# Patient Record
Sex: Male | Born: 1965 | Hispanic: Yes | Marital: Single | Smoking: Current every day smoker
Health system: Southern US, Community
[De-identification: ages and names within clinical notes are randomized; demographics above are authoritative.]

## PROBLEM LIST (undated history)

## (undated) DIAGNOSIS — N200 Calculus of kidney: Secondary | ICD-10-CM

## (undated) DIAGNOSIS — E119 Type 2 diabetes mellitus without complications: Secondary | ICD-10-CM

---

## 2013-08-12 ENCOUNTER — Emergency Department: Payer: Self-pay | Admitting: Internal Medicine

## 2019-08-13 ENCOUNTER — Other Ambulatory Visit: Payer: Self-pay

## 2019-08-13 DIAGNOSIS — Z20822 Contact with and (suspected) exposure to covid-19: Secondary | ICD-10-CM

## 2019-08-14 LAB — NOVEL CORONAVIRUS, NAA: SARS-CoV-2, NAA: DETECTED — AB

## 2019-11-16 ENCOUNTER — Encounter: Payer: Self-pay | Admitting: Emergency Medicine

## 2019-11-16 ENCOUNTER — Other Ambulatory Visit: Payer: Self-pay

## 2019-11-16 ENCOUNTER — Emergency Department
Admission: EM | Admit: 2019-11-16 | Discharge: 2019-11-16 | Disposition: A | Discharge status: Home / Self Care | Payer: Self-pay | Attending: Emergency Medicine | Admitting: Emergency Medicine

## 2019-11-16 ENCOUNTER — Emergency Department: Payer: Self-pay

## 2019-11-16 DIAGNOSIS — Z23 Encounter for immunization: Secondary | ICD-10-CM | POA: Insufficient documentation

## 2019-11-16 DIAGNOSIS — W268XXA Contact with other sharp object(s), not elsewhere classified, initial encounter: Secondary | ICD-10-CM | POA: Insufficient documentation

## 2019-11-16 DIAGNOSIS — F17200 Nicotine dependence, unspecified, uncomplicated: Secondary | ICD-10-CM | POA: Insufficient documentation

## 2019-11-16 DIAGNOSIS — Y9389 Activity, other specified: Secondary | ICD-10-CM | POA: Insufficient documentation

## 2019-11-16 DIAGNOSIS — Y929 Unspecified place or not applicable: Secondary | ICD-10-CM | POA: Insufficient documentation

## 2019-11-16 DIAGNOSIS — S62636A Displaced fracture of distal phalanx of right little finger, initial encounter for closed fracture: Secondary | ICD-10-CM | POA: Insufficient documentation

## 2019-11-16 DIAGNOSIS — Y999 Unspecified external cause status: Secondary | ICD-10-CM | POA: Insufficient documentation

## 2019-11-16 DIAGNOSIS — S61219A Laceration without foreign body of unspecified finger without damage to nail, initial encounter: Secondary | ICD-10-CM

## 2019-11-16 DIAGNOSIS — E119 Type 2 diabetes mellitus without complications: Secondary | ICD-10-CM | POA: Insufficient documentation

## 2019-11-16 DIAGNOSIS — S62636B Displaced fracture of distal phalanx of right little finger, initial encounter for open fracture: Secondary | ICD-10-CM

## 2019-11-16 DIAGNOSIS — S61216A Laceration without foreign body of right little finger without damage to nail, initial encounter: Secondary | ICD-10-CM | POA: Insufficient documentation

## 2019-11-16 HISTORY — DX: Type 2 diabetes mellitus without complications: E11.9

## 2019-11-16 MED ORDER — LIDOCAINE HCL (PF) 1 % IJ SOLN
5.0000 mL | Freq: Once | INTRAMUSCULAR | Status: AC
  Administered 2019-11-16: 17:00:00 5 mL
  Filled 2019-11-16: qty 5

## 2019-11-16 MED ORDER — CEPHALEXIN 500 MG PO CAPS: 500.0000 mg | ORAL_CAPSULE | Freq: Three times a day (TID) | ORAL | 0 refills | Status: AC

## 2019-11-16 MED ORDER — TETANUS-DIPHTH-ACELL PERTUSSIS 5-2.5-18.5 LF-MCG/0.5 IM SUSP
0.5000 mL | Freq: Once | INTRAMUSCULAR | Status: AC
  Administered 2019-11-16: 17:00:00 0.5 mL via INTRAMUSCULAR
  Filled 2019-11-16: qty 0.5

## 2019-11-16 NOTE — ED Notes (Signed)
See triage note  Presents with laceration to right 5 th finger  States he was working with a pipe and the pipe slipped  Hitting finger

## 2019-11-16 NOTE — ED Notes (Signed)
Deep cut to distal right fifth finger.  Able to bend finger.  Bleeding controlled.  DSD applied.

## 2019-11-16 NOTE — ED Provider Notes (Signed)
[facility] Emergency Department Provider Note  ____________________________________________   First MD Initiated Contact with Patient 11/16/19 1545     (approximate)  I have reviewed the triage vital signs and the nursing notes.   HISTORY  Chief Complaint Laceration    HPI Collin Peters is a 53 y.o. male presents emergency department complaining of right fifth finger  injury.  Patient states they had cut pipe and he was going to put it together and it slipped cutting his finger.  States area was very sharp.  Unsure of his last tetanus.  No other injuries reported.   Past Medical History:  Diagnosis Date   Diabetes mellitus without complication (HCC)     There are no active problems to display for this patient.   History reviewed. No pertinent surgical history.  Prior to Admission medications   Medication Sig Start Date End Date Taking? Authorizing Provider  cephALEXin (KEFLEX) 500 MG capsule Take 1 capsule (500 mg total) by mouth 3 (three) times daily. 11/16/19   Faythe Ghee, PA-C    Allergies Patient has no known allergies.  No family history on file.  Social History Social History   Tobacco Use   Smoking status: Current Every Day Smoker   Smokeless tobacco: Never Used  Substance Use Topics   Alcohol use: Not on file   Drug use: Not on file    Review of Systems  Constitutional: No fever/chills Eyes: No visual changes. ENT: No sore throat. Respiratory: Denies cough Genitourinary: Negative for dysuria. Musculoskeletal: Negative for back pain.  Positive laceration to the right fifth finger Skin: Negative for rash.    ____________________________________________   PHYSICAL EXAM:  VITAL SIGNS: ED Triage Vitals  Enc Vitals Group     BP 11/16/19 1519 (!) 146/94     Pulse Rate 11/16/19 1519 80     Resp 11/16/19 1519 16     Temp 11/16/19 1519 97.6 F (36.4 C)     Temp Source 11/16/19 1519 Oral     SpO2  11/16/19 1519 100 %     Weight 11/16/19 1516 220 lb (99.8 kg)     Height 11/16/19 1516 5\' 9"  (1.753 m)     Head Circumference --      Peak Flow --      Pain Score 11/16/19 1516 5     Pain Loc --      Pain Edu? --      Excl. in [place]? --     Constitutional: Alert and oriented. Well appearing and in no acute distress. Eyes: Conjunctivae are normal.  Head: Atraumatic. Nose: No congestion/rhinnorhea. Mouth/Throat: Mucous membranes are moist.   Neck:  supple no lymphadenopathy noted Cardiovascular: Normal rate, regular rhythm. Respiratory: Normal respiratory effort.  No retractions,  GU: deferred Musculoskeletal: FROM all extremities, warm and well perfused, positive laceration to the right fifth finger, tender at the distal phalanx, neurovascular intact Neurologic:  Normal speech and language.  Skin:  Skin is warm, dry . No rash noted. Psychiatric: Mood and affect are normal. Speech and behavior are normal.  ____________________________________________   LABS (all labs ordered are listed, but only abnormal results are displayed)  Labs Reviewed - No data to display ____________________________________________   ____________________________________________  RADIOLOGY  X-ray of the right fifth finger shows a distal tuft fracture  ____________________________________________   PROCEDURES  Procedure(s) performed:   11/26/20Marland KitchenLaceration Repair  Date/Time: 11/16/2019 5:02 PM Performed by: 11/18/2019, PA-C Authorized by: Faythe Ghee, PA-C  Consent:    Consent obtained:  Verbal   Consent given by:  Patient   Risks discussed:  Infection, pain, retained foreign body, tendon damage, poor cosmetic result, need for additional repair, nerve damage, poor wound healing and vascular damage Anesthesia (see MAR for exact dosages):    Anesthesia method:  Nerve block   Block needle gauge:  27 G   Block anesthetic:  Lidocaine 1% w/o epi   Block injection procedure:  Anatomic  landmarks identified, introduced needle, incremental injection, anatomic landmarks palpated and negative aspiration for blood   Block outcome:  Anesthesia achieved Laceration details:    Location:  Finger   Finger location:  R small finger   Length (cm):  6   Depth (mm):  2 Repair type:    Repair type:  Simple Pre-procedure details:    Preparation:  Patient was prepped and draped in usual sterile fashion Exploration:    Hemostasis achieved with:  Direct pressure   Wound exploration: wound explored through full range of motion     Wound extent: underlying fracture     Wound extent: no foreign bodies/material noted and no tendon damage noted     Contaminated: no   Treatment:    Area cleansed with:  Betadine and saline   Amount of cleaning:  Extensive   Irrigation solution:  Sterile saline   Irrigation method:  Syringe and tap Skin repair:    Repair method:  Sutures   Suture size:  5-0   Suture material:  Nylon   Suture technique:  Simple interrupted   Number of sutures:  6 Approximation:    Approximation:  Close Post-procedure details:    Dressing:  Non-adherent dressing and splint for protection   Patient tolerance of procedure:  Tolerated well, no immediate complications      ____________________________________________   INITIAL IMPRESSION / ASSESSMENT AND PLAN / ED COURSE  Pertinent labs & imaging results that were available during my care of the patient were reviewed by me and considered in my medical decision making (see chart for details).   Patient is a 53 year old male presents emergency department with right fifth finger injury.  See HPI  Physical exam shows patient appears well.  Laceration and contusion noted to the right fifth finger.  X-ray of the right fifth finger shows a distal tuft fracture  Tdap updated by the nursing staff  See procedure note for repair.  Patient was placed in a finger splint and dressing was applied by nursing staff.  He was put  on Keflex.  Patient is refusing pain medication as he would like to drink alcohol instead of taking narcotics.  He is to follow-up with orthopedics.  Have sutures removed in 7 days.   Collin Peters was evaluated in Emergency Department on 11/16/2019 for the symptoms described in the history of present illness. He was evaluated in the context of the global COVID-19 pandemic, which necessitated consideration that the patient might be at risk for infection with the SARS-CoV-2 virus that causes COVID-19. Institutional protocols and algorithms that pertain to the evaluation of patients at risk for COVID-19 are in a state of rapid change based on information released by regulatory bodies including the CDC and federal and state organizations. These policies and algorithms were followed during the patient's care in the ED.   As part of my medical decision making, I reviewed the following data within the [place] notes reviewed and incorporated, Old chart reviewed, Radiograph reviewed ,  Notes from prior ED visits and [place] Controlled Substance Database  ____________________________________________   FINAL CLINICAL IMPRESSION(S) / ED DIAGNOSES  Final diagnoses:  Laceration of right little finger without foreign body without damage to nail, initial encounter  Open displaced fracture of distal phalanx of right little finger, initial encounter      NEW MEDICATIONS STARTED DURING THIS VISIT:  Discharge Medication List as of 11/16/2019  4:57 PM    START taking these medications   Details  cephALEXin (KEFLEX) 500 MG capsule Take 1 capsule (500 mg total) by mouth 3 (three) times daily., Starting Tue 11/16/2019, Normal         Note:  This document was prepared using Dragon voice recognition software and may include unintentional dictation errors.    Faythe GheeFisher, Dagan Heinz W, PA-C 11/16/19 1727    Emily FilbertWilliams, Jonathan E, MD 11/16/19 [phone]

## 2019-11-16 NOTE — ED Triage Notes (Signed)
Cut right fifth finger at work.  States cut finger on drainage pipe.

## 2019-11-16 NOTE — ED Notes (Signed)
No WC profile found for [employer]

## 2019-11-26 ENCOUNTER — Other Ambulatory Visit: Payer: Self-pay

## 2019-11-26 ENCOUNTER — Emergency Department
Admission: EM | Admit: 2019-11-26 | Discharge: 2019-11-26 | Disposition: A | Discharge status: Home / Self Care | Payer: Self-pay | Attending: Emergency Medicine | Admitting: Emergency Medicine

## 2019-11-26 DIAGNOSIS — F17210 Nicotine dependence, cigarettes, uncomplicated: Secondary | ICD-10-CM | POA: Insufficient documentation

## 2019-11-26 DIAGNOSIS — Z4802 Encounter for removal of sutures: Secondary | ICD-10-CM

## 2019-11-26 DIAGNOSIS — E119 Type 2 diabetes mellitus without complications: Secondary | ICD-10-CM | POA: Insufficient documentation

## 2019-11-26 DIAGNOSIS — X58XXXD Exposure to other specified factors, subsequent encounter: Secondary | ICD-10-CM | POA: Insufficient documentation

## 2019-11-26 DIAGNOSIS — S61216D Laceration without foreign body of right little finger without damage to nail, subsequent encounter: Secondary | ICD-10-CM | POA: Insufficient documentation

## 2019-11-26 NOTE — ED Notes (Signed)
See triage note  Presents for suture removal    Sutures intact to right 5th finger

## 2019-11-26 NOTE — ED Triage Notes (Signed)
Pt to ED via POV for suture removal. Pt is in NAD

## 2019-11-26 NOTE — Discharge Instructions (Addendum)
Clean daily with mild soap and water.  Allow area to dry completely.  Continue to watch for any signs of infection.

## 2019-11-26 NOTE — ED Provider Notes (Signed)
[facility] Emergency Department Provider Note  ____________________________________________   First MD Initiated Contact with Patient 11/26/19 1445     (approximate)  I have reviewed the triage vital signs and the nursing notes.   HISTORY  Chief Complaint Suture / Staple Removal    HPI Collin Peters is a 53 y.o. male presents to the ED for suture removal.  Patient was seen in the ED on 11/16/2019 for laceration of his right fifth digit.  He denies any problems.  No drainage.       Past Medical History:  Diagnosis Date  . Diabetes mellitus without complication ([place])     There are no active problems to display for this patient.   No past surgical history on file.  Prior to Admission medications   Medication Sig Start Date End Date Taking? Authorizing Provider  cephALEXin (KEFLEX) 500 MG capsule Take 1 capsule (500 mg total) by mouth 3 (three) times daily. 11/16/19   Versie Starks, PA-C    Allergies Patient has no known allergies.  No family history on file.  Social History Social History   Tobacco Use  . Smoking status: Current Every Day Smoker  . Smokeless tobacco: Never Used  Substance Use Topics  . Alcohol use: Not on file  . Drug use: Not on file    Review of Systems Constitutional: No fever/chills Cardiovascular: Denies chest pain. Respiratory: Denies shortness of breath. Musculoskeletal: Negative for finger pain. Skin: Sutured right fifth finger. Neurological: Negative for headaches, focal weakness or numbness. ___________________________________________   PHYSICAL EXAM:  VITAL SIGNS: ED Triage Vitals [11/26/19 1440]  Enc Vitals Group     BP (!) 132/93     Pulse Rate 89     Resp 16     Temp 98.8 F (37.1 C)     Temp Source Oral     SpO2 96 %     Weight      Height      Head Circumference      Peak Flow      Pain Score 0     Pain Loc      Pain Edu?      Excl. in [place]?    Constitutional: Alert and  oriented. Well appearing and in no acute distress. Eyes: Conjunctivae are normal.  Head: Atraumatic. Neck: No stridor.   Cardiovascular: Normal rate, regular rhythm. Grossly normal heart sounds.  Good peripheral circulation. Respiratory: Normal respiratory effort.  No retractions. Lungs CTAB. Musculoskeletal: On examination of the right fifth finger there is no gross deformity noted and patient is able to flex and extend without difficulty.  Sutured area is without erythema or drainage. Neurologic:  Normal speech and language. No gross focal neurologic deficits are appreciated. No gait instability. Skin:  Skin is warm, dry. Psychiatric: Mood and affect are normal. Speech and behavior are normal.  ____________________________________________   LABS (all labs ordered are listed, but only abnormal results are displayed)  Labs Reviewed - No data to display  PROCEDURES  Procedure(s) performed (including Critical Care):  Procedures Sutures were removed by this provider.  ____________________________________________   INITIAL IMPRESSION / ASSESSMENT AND PLAN / ED COURSE  As part of my medical decision making, I reviewed the following data within the electronic MEDICAL RECORD NUMBER Notes from prior ED visits and [place] Controlled Substance Database  53 year old male presents to the ED for suture removal.  Patient was seen on 11/16/2023 a laceration to his right fifth finger.  Sutures  were removed by this provider without any difficulty.  ____________________________________________   FINAL CLINICAL IMPRESSION(S) / ED DIAGNOSES  Final diagnoses:  Encounter for removal of sutures     ED Discharge Orders    None       Note:  This document was prepared using Dragon voice recognition software and may include unintentional dictation errors.    Tommi Rumps, PA-C 11/26/19 1501    Concha Se, MD 11/27/19 [phone]

## 2020-08-16 IMAGING — DX DG FINGER LITTLE 2+V*R*
3 series · 3 of 3 positions shown · non-contrast
Comparison: None.

CLINICAL DATA: Small finger laceration.

EXAM:
RIGHT LITTLE FINGER 2+V

[finger ap]
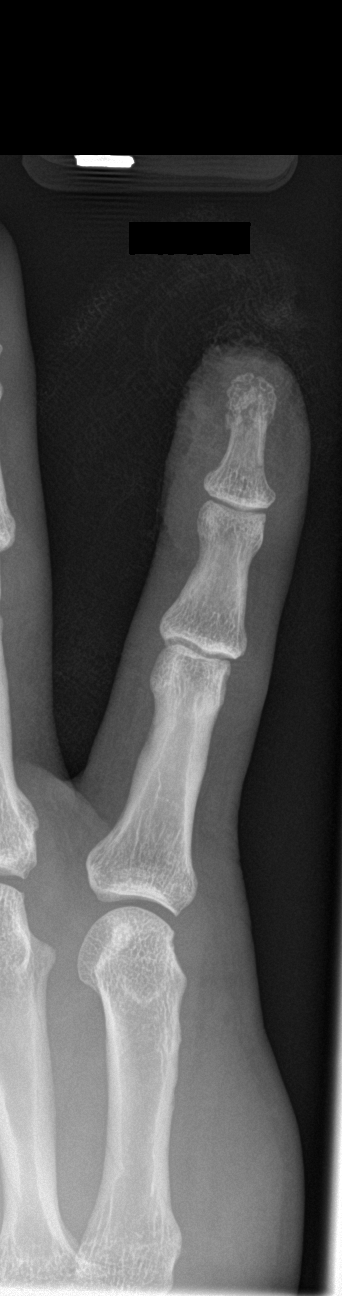

[finger obl]
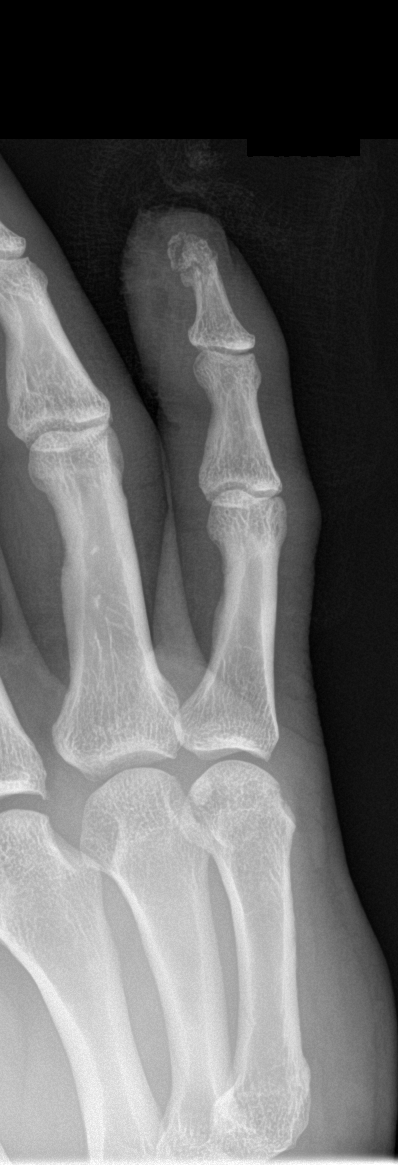

[finger lat]
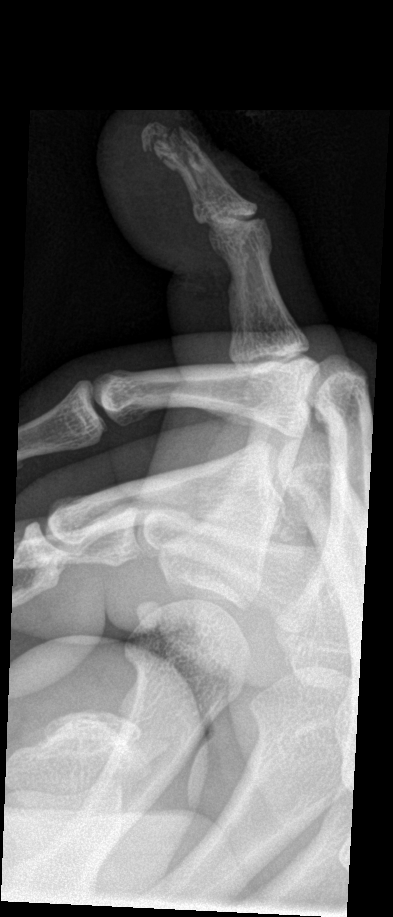

[3 of 3 positions shown; findings below may reference images not displayed]

FINDINGS: Acute comminuted minimally displaced fracture of the fifth distal
phalanx tuft with overlying soft tissue laceration and irregularity.
No dislocation. Joint spaces are preserved. Bone mineralization is
normal.
IMPRESSION: 1. Soft tissue injury and laceration at the tip of the small finger
with underlying acute fracture of the fifth distal phalanx tuft.

## 2020-12-26 ENCOUNTER — Other Ambulatory Visit: Payer: Self-pay

## 2020-12-26 ENCOUNTER — Ambulatory Visit
Admission: EM | Admit: 2020-12-26 | Discharge: 2020-12-26 | Disposition: A | Discharge status: Home / Self Care | Payer: Self-pay | Attending: Physician Assistant | Admitting: Physician Assistant

## 2020-12-26 DIAGNOSIS — L299 Pruritus, unspecified: Secondary | ICD-10-CM

## 2020-12-26 DIAGNOSIS — L259 Unspecified contact dermatitis, unspecified cause: Secondary | ICD-10-CM

## 2020-12-26 MED ORDER — PREDNISONE 10 MG (21) PO TBPK: ORAL_TABLET | Freq: Every day | ORAL | 0 refills | Status: AC

## 2020-12-26 MED ORDER — METHYLPREDNISOLONE SODIUM SUCC 125 MG IJ SOLR
125.0000 mg | Freq: Once | INTRAMUSCULAR | Status: AC
  Administered 2020-12-26: 125 mg via INTRAMUSCULAR

## 2020-12-26 NOTE — ED Triage Notes (Signed)
Pt sts he was cutting down bushes on Saturday and got into some poison ivy. Rashes on bila arms and hand lower abd. sts he have been taking benadryl and using calamine lotion with some relief.

## 2020-12-26 NOTE — Discharge Instructions (Addendum)
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 Empiece a tomar las pastillas de corticosteroides maana. Puede continuar con Benadryl para la picazn. Intenta no rascarte. Haga un seguimiento con nosotros si no mejora en los prximos das o si el sarpullido parece estar extendindose. Vaya a la sala de emergencias si tiene alguna dificultad para respirar, debilidad o dificultad para tragar.   You have been given a corticosteroid injection in the clinic today since your poison ivy rash is severe and affecting your face.  Start the corticosteroid pills tomorrow.  You can continue Benadryl for itching.  Try not to scratch.  Follow-up with [place] if you are not getting better over the next few days or if the rash seems to be spreading.  Go to ER if you have any breathing difficulty, weakness, or trouble swallowing.

## 2020-12-26 NOTE — ED Provider Notes (Signed)
[facility]    CSN: [id number] Arrival date & time: 12/26/20  1040      History   Chief Complaint Chief Complaint  Patient presents with  . Poison Ivy    HPI Collin Peters is a 55 y.o. male presenting for rash of the face, arms, chest, abdomen, and groin region.  Patient states that he was cutting down poison ivy at his house 3 days ago and then developed a rash after that.  He has been taking Benadryl and applying calamine lotion without any relief.  He says it seems to be spreading.  He admits to intense itching, but denies any pain.  He states that he is sensitive to poison ivy and has broken out this bad in the past.  He denies any fever or weakness.  He says that his face feels swollen.  Denies any swallowing or breathing difficulty.  Denies any chest tightness or throat tightness.  No other complaints or concerns.  HPI  Past Medical History:  Diagnosis Date  . Diabetes mellitus without complication (HCC)     There are no problems to display for this patient.   History reviewed. No pertinent surgical history.     Home Medications    Prior to Admission medications   Medication Sig Start Date End Date Taking? Authorizing Provider  predniSONE (STERAPRED UNI-PAK 21 TAB) 10 MG (21) TBPK tablet Take by mouth daily. Take 6 tabs by mouth daily  for 2 days, then 5 tabs for 2 days, then 4 tabs for 2 days, then 3 tabs for 2 days, 2 tabs for 2 days, then 1 tab by mouth daily for 2 days 12/26/20  Yes Eusebio Friendly B, PA-C  cephALEXin (KEFLEX) 500 MG capsule Take 1 capsule (500 mg total) by mouth 3 (three) times daily. 11/16/19   Faythe Ghee, PA-C    Family History No family history on file.  Social History Social History   Tobacco Use  . Smoking status: Current Every Day Smoker  . Smokeless tobacco: Never Used  Substance Use Topics  . Alcohol use: Yes  . Drug use: Never     Allergies   Patient has no known allergies.   Review of  Systems Review of Systems  Constitutional: Negative for fatigue and fever.  HENT: Positive for facial swelling. Negative for congestion and trouble swallowing.   Eyes: Negative for photophobia, discharge, redness and visual disturbance.  Respiratory: Negative for chest tightness, shortness of breath and wheezing.   Musculoskeletal: Negative for joint swelling.  Skin: Positive for rash.  Neurological: Negative for dizziness, weakness and headaches.     Physical Exam Triage Vital Signs ED Triage Vitals  Enc Vitals Group     BP 12/26/20 1256 (!) 144/105     Pulse Rate 12/26/20 1256 80     Resp 12/26/20 1256 16     Temp 12/26/20 1256 97.6 F (36.4 C)     Temp Source 12/26/20 1256 Oral     SpO2 12/26/20 1256 100 %     Weight 12/26/20 1257 220 lb (99.8 kg)     Height 12/26/20 1257 5\' 9"  (1.753 m)     Head Circumference --      Peak Flow --      Pain Score 12/26/20 1257 0     Pain Loc --      Pain Edu? --      Excl. in [place]? --    No data found.  Updated Vital Signs BP 02/23/21)  144/105   Pulse 80   Temp 97.6 F (36.4 C) (Oral)   Resp 16   Ht 5\' 9"  (1.753 m)   Wt 220 lb (99.8 kg)   SpO2 100%   BMI 32.49 kg/m       Physical Exam Vitals and nursing note reviewed.  Constitutional:      General: He is not in acute distress.    Appearance: Normal appearance. He is well-developed and well-nourished. He is not ill-appearing or toxic-appearing.  HENT:     Head: Normocephalic and atraumatic.     Nose: Nose normal. No congestion.     Mouth/Throat:     Mouth: Mucous membranes are moist.     Pharynx: Oropharynx is clear.  Eyes:     General: No scleral icterus.    Conjunctiva/sclera: Conjunctivae normal.  Cardiovascular:     Rate and Rhythm: Normal rate and regular rhythm.     Heart sounds: Normal heart sounds.  Pulmonary:     Effort: Pulmonary effort is normal. No respiratory distress.     Breath sounds: Normal breath sounds.  Abdominal:     Palpations: Abdomen is soft.      Tenderness: There is no abdominal tenderness.  Musculoskeletal:        General: No edema.     Cervical back: Neck supple.  Skin:    General: Skin is warm and dry.     Findings: Rash (vesicular and erythematous patchy and diffuse rash affecting face, bilat UEs, chest, abdomen and bilat groin. Face is moderately swollen and erythematous) present.  Neurological:     General: No focal deficit present.     Mental Status: He is alert. Mental status is at baseline.  Psychiatric:        Mood and Affect: Mood and affect and mood normal.        Behavior: Behavior normal.        Thought Content: Thought content normal.      UC Treatments / Results  Labs (all labs ordered are listed, but only abnormal results are displayed) Labs Reviewed - No data to display  EKG   Radiology No results found.  Procedures Procedures (including critical care time)  Medications Ordered in UC Medications  methylPREDNISolone sodium succinate (SOLU-MEDROL) 125 mg/2 mL injection 125 mg (125 mg Intramuscular Given 12/26/20 1334)    Initial Impression / Assessment and Plan / UC Course  I have reviewed the triage vital signs and the nursing notes.  Pertinent labs & imaging results that were available during my care of the patient were reviewed by me and considered in my medical decision making (see chart for details).   Rash is consistent with irritant dermatitis likely due to poison ivy since he states that he was cutting down poison ivy at his house.  The rash is significant and affecting his face and diffusely throughout his body.  Patient given Solu-Medrol in clinic and sent prednisone Dosepak for him to start tomorrow.  Return and ED precautions discussed with patient thoroughly.   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 Empiece a tomar las pastillas de corticosteroides maana. Puede continuar con Benadryl para la picazn. Intenta no rascarte. Haga un seguimiento con nosotros si  no mejora en los prximos das o si el sarpullido parece estar extendindose. Vaya a la sala de emergencias si tiene alguna dificultad para respirar, debilidad o dificultad para tragar.   You have been given a corticosteroid injection in the clinic today since your poison ivy rash is severe and affecting your face.  Start the corticosteroid pills tomorrow.  You can continue Benadryl for itching.  Try not to scratch.  Follow-up with [place] if you are not getting better over the next few days or if the rash seems to be spreading.  Go to ER if you have any breathing difficulty, weakness, or trouble swallowing.    ED Prescriptions    Medication Sig Dispense Auth. Provider   predniSONE (STERAPRED UNI-PAK 21 TAB) 10 MG (21) TBPK tablet Take by mouth daily. Take 6 tabs by mouth daily  for 2 days, then 5 tabs for 2 days, then 4 tabs for 2 days, then 3 tabs for 2 days, 2 tabs for 2 days, then 1 tab by mouth daily for 2 days 42 tablet Shirlee Latch, PA-C     PDMP not reviewed this encounter.   Shirlee Latch, PA-C 12/26/20 1337

## 2021-11-26 DIAGNOSIS — Z23 Encounter for immunization: Secondary | ICD-10-CM | POA: Diagnosis not present

## 2022-07-13 ENCOUNTER — Ambulatory Visit
Admission: EM | Admit: 2022-07-13 | Discharge: 2022-07-13 | Disposition: A | Discharge status: Home / Self Care | Payer: No Typology Code available for payment source | Attending: Physician Assistant | Admitting: Physician Assistant

## 2022-07-13 DIAGNOSIS — R300 Dysuria: Secondary | ICD-10-CM | POA: Diagnosis present

## 2022-07-13 DIAGNOSIS — N481 Balanitis: Secondary | ICD-10-CM | POA: Diagnosis present

## 2022-07-13 DIAGNOSIS — E1165 Type 2 diabetes mellitus with hyperglycemia: Secondary | ICD-10-CM | POA: Diagnosis present

## 2022-07-13 DIAGNOSIS — R6882 Decreased libido: Secondary | ICD-10-CM

## 2022-07-13 LAB — URINALYSIS, ROUTINE W REFLEX MICROSCOPIC
Bilirubin Urine: NEGATIVE
Glucose, UA: >1000 mg/dL — AB
Hgb urine dipstick: NEGATIVE
Leukocytes,Ua: NEGATIVE
Nitrite: NEGATIVE
Protein, ur: NEGATIVE mg/dL
Specific Gravity, Urine: 1.02 (ref 1.005–1.030)
pH: 6.5 (ref 5.0–8.0)

## 2022-07-13 LAB — GLUCOSE, CAPILLARY: Glucose-Capillary: 327 mg/dL — ABNORMAL HIGH (ref 70–99)

## 2022-07-13 MED ORDER — CLOTRIMAZOLE 1 % EX CREA: TOPICAL_CREAM | CUTANEOUS | 0 refills | Status: AC

## 2022-07-13 NOTE — ED Triage Notes (Signed)
Used Interpreter Susana [phone]  Pt c/o burning after urinating x5days.   Pt is having a decrease of Libido x1year.   Pt is not having full bowel movements and feels like there is more that did not come out x38months.  Pt asks for a check of his colon.  Pt denies any new sexual partners and is not having any drainage, itching, or abnormal swelling.

## 2022-07-13 NOTE — ED Provider Notes (Signed)
[facility]    CSN: [id number] Arrival date & time: 07/13/22  6226      History   Chief Complaint Chief Complaint  Patient presents with   Urinary Tract Infection   Constipation   Sexual Problem    HPI Jlen Wintle is a 56 y.o. male presenting for approximately 5-day history of dysuria.  Also reports urinary frequency for a longer period of time than that.  He has not had any testicular pain or swelling.  Denies GU rashes.  No concern for STIs as he only has 1 partner.  He denies any penile discharge or blood in the urine.  No fever, back pain or abdominal/pelvic pain.  No similar problems in the past.  Patient also reports concerns about decreased libido for the past 1 year.  He has not followed up with a provider about this.  He does not have a PCP.  He does report that someone told him he was diabetic about 5 years ago but he never was given any medication and does not take any medicine.  He reports to smoking tobacco every day and frequent alcohol use as well.  Interpreter service used.  HPI  Past Medical History:  Diagnosis Date   Diabetes mellitus without complication (HCC)     There are no problems to display for this patient.   History reviewed. No pertinent surgical history.     Home Medications    Prior to Admission medications   Medication Sig Start Date End Date Taking? Authorizing Provider  clotrimazole (LOTRIMIN) 1 % cream Apply to affected area 2 times daily 07/13/22 07/20/22 Yes Eusebio Friendly B, PA-C  cephALEXin (KEFLEX) 500 MG capsule Take 1 capsule (500 mg total) by mouth 3 (three) times daily. 11/16/19   Fisher, Roselyn Bering, PA-C  predniSONE (STERAPRED UNI-PAK 21 TAB) 10 MG (21) TBPK tablet Take by mouth daily. Take 6 tabs by mouth daily  for 2 days, then 5 tabs for 2 days, then 4 tabs for 2 days, then 3 tabs for 2 days, 2 tabs for 2 days, then 1 tab by mouth daily for 2 days 12/26/20   Shirlee Latch, PA-C    Family History History  reviewed. No pertinent family history.  Social History Social History   Tobacco Use   Smoking status: Every Day   Smokeless tobacco: Never  Vaping Use   Vaping Use: Never used  Substance Use Topics   Alcohol use: Yes   Drug use: Never     Allergies   Patient has no known allergies.   Review of Systems Review of Systems  Constitutional:  Negative for fatigue and fever.  Gastrointestinal:  Negative for abdominal pain, nausea and vomiting.  Genitourinary:  Positive for dysuria and frequency. Negative for genital sores, hematuria, penile discharge, penile pain, penile swelling, scrotal swelling, testicular pain and urgency.  Musculoskeletal:  Negative for arthralgias.  Skin:  Negative for rash.  Neurological:  Negative for weakness.     Physical Exam Triage Vital Signs ED Triage Vitals  Enc Vitals Group     BP 07/13/22 0856 (!) 152/101     Pulse Rate 07/13/22 0856 91     Resp 07/13/22 0856 18     Temp 07/13/22 0856 98.8 F (37.1 C)     Temp Source 07/13/22 0856 Oral     SpO2 07/13/22 0856 96 %     Weight 07/13/22 0854 190 lb (86.2 kg)     Height 07/13/22 0854 5\' 9"  (  1.753 m)     Head Circumference --      Peak Flow --      Pain Score 07/13/22 0854 4     Pain Loc --      Pain Edu? --      Excl. in [place]? --    No data found.  Updated Vital Signs BP (!) 152/101 (BP Location: Left Arm)   Pulse 91   Temp 98.8 F (37.1 C) (Oral)   Resp 18   Ht 5\' 9"  (1.753 m)   Wt 190 lb (86.2 kg)   SpO2 96%   BMI 28.06 kg/m       Physical Exam Vitals and nursing note reviewed. Exam conducted with a chaperone present.  Constitutional:      General: He is not in acute distress.    Appearance: Normal appearance. He is well-developed. He is not ill-appearing.  HENT:     Head: Normocephalic and atraumatic.  Eyes:     General: No scleral icterus.    Conjunctiva/sclera: Conjunctivae normal.  Cardiovascular:     Rate and Rhythm: Normal rate and regular rhythm.     Heart  sounds: Normal heart sounds.  Pulmonary:     Effort: Pulmonary effort is normal. No respiratory distress.     Breath sounds: Normal breath sounds.  Abdominal:     Palpations: Abdomen is soft.     Tenderness: There is no abdominal tenderness.  Genitourinary:    Penis: Uncircumcised. No tenderness, discharge or swelling.      Testes: Normal.     Comments: Erythematous rash at base of glans Musculoskeletal:     Cervical back: Neck supple.  Skin:    General: Skin is warm and dry.     Capillary Refill: Capillary refill takes less than 2 seconds.  Neurological:     General: No focal deficit present.     Mental Status: He is alert. Mental status is at baseline.     Motor: No weakness.     Gait: Gait normal.  Psychiatric:        Mood and Affect: Mood normal.        Behavior: Behavior normal.      UC Treatments / Results  Labs (all labs ordered are listed, but only abnormal results are displayed) Labs Reviewed  URINALYSIS, ROUTINE W REFLEX MICROSCOPIC - Abnormal; Notable for the following components:      Result Value   Glucose, UA >1,000 (*)    Ketones, ur TRACE (*)    All other components within normal limits  GLUCOSE, CAPILLARY - Abnormal; Notable for the following components:   Glucose-Capillary 327 (*)    All other components within normal limits  CBG MONITORING, ED  CYTOLOGY, (ORAL, ANAL, URETHRAL) ANCILLARY ONLY    EKG   Radiology No results found.  Procedures Procedures (including critical care time)  Medications Ordered in UC Medications - No data to display  Initial Impression / Assessment and Plan / UC Course  I have reviewed the triage vital signs and the nursing notes.  Pertinent labs & imaging results that were available during my care of the patient were reviewed by me and considered in my medical decision making (see chart for details).    1.  Dysuria: Patient reporting 5-day history of dysuria.  No discharge or concern for STIs.  No history of  UTIs.  On exam he does have a rash at the base of the glans.    Urinalysis today shows no sign  of UTI, only greater than 1000 glucose and trace ketones.  No suspicion for acute UTI.  Suspect patient's symptoms are related to acute balanitis, likely fungal given that he has uncontrolled diabetes.  Sent clotrimazole to pharmacy.  We also obtained GC/chlamydia testing and will call patient if positive for low suspicion for this as he reports only 1 sexual partner.  2.  Uncontrolled type 2 diabetes: Patient reports he does have a history of diabetes but never took any medicine for it.  As mentioned, urinalysis shows greater than 1000 glucose.  Fingerstick glucose is 327.  Patient reports not having eaten anything today.  Advised him that he needs further work-up and treatment for diabetes.  [facility] and advised him to contact them on Monday.  We discussed dietary changes, exercise and weight loss at this time.  3.  Decreased libido: Advised patient we do not perform work-up for this in urgent care setting.  It has also been going on for 1 year and is not a chronic issue.  Advised his primary care provider can likely work him up for this or refer him out.  Advised his decreased libido may be affected by his uncontrolled diabetes, alcohol use and tobacco use.   Final Clinical Impressions(s) / UC Diagnoses   Final diagnoses:  Balanitis  Dysuria  Uncontrolled type 2 diabetes mellitus with hyperglycemia (HCC)     Discharge Instructions      -No tienes una infeccin urinaria -Tiene una erupcin en el pene que es compatible con una infeccin por hongos. [place] la crema que envi a la farmacia. -Tambin hablamos sobre el hecho de que tiene diabetes no controlada y que puede provocar estas infecciones y causar problemas con la libido y las erecciones. -Debe hacer un seguimiento con la oficina a continuacin para una cita para establecer como paciente y hacerse exmenes de  laboratorio y tratamiento para la diabetes. -Hacer ejercicio, bajar de peso y disminuir el consumo de carbohidratos, azcar, alcohol.  [facility] at [facility], [address]. Phone number: [phone]  -You do not have a urinary infection -You do have a rash on your penis which is consistent with fungal infection. Use the cream I sent to the pharmacy. -We also talked about the fact you have uncontrolled diabetes and than can lead to these infections and cause issues with libido and getting erections.  -You should follow up with the office below for appointment to establish as a patient and have labs checked and treatment for diabetes. -Exercise, lose weight and decrease consumption of carbs, sugar, alcohol.       ED Prescriptions     Medication Sig Dispense Auth. Provider   clotrimazole (LOTRIMIN) 1 % cream Apply to affected area 2 times daily 15 g Shirlee Latch, PA-C      PDMP not reviewed this encounter.   Shirlee Latch, PA-C 07/13/22 1018

## 2022-07-13 NOTE — Discharge Instructions (Addendum)
-  No tienes una infeccin urinaria -Tiene una erupcin en el pene que es compatible con una infeccin por hongos. [place] la crema que envi a la farmacia. -Tambin hablamos sobre el hecho de que tiene diabetes no controlada y que puede provocar estas infecciones y causar problemas con la libido y las erecciones. -Debe hacer un seguimiento con la oficina a continuacin para una cita para establecer como paciente y hacerse exmenes de laboratorio y tratamiento para la diabetes. -Hacer ejercicio, bajar de peso y disminuir el consumo de carbohidratos, azcar, alcohol.  [facility] at [facility], [address]. Phone number: [phone]  -You do not have a urinary infection -You do have a rash on your penis which is consistent with fungal infection. Use the cream I sent to the pharmacy. -We also talked about the fact you have uncontrolled diabetes and than can lead to these infections and cause issues with libido and getting erections.  -You should follow up with the office below for appointment to establish as a patient and have labs checked and treatment for diabetes. -Exercise, lose weight and decrease consumption of carbs, sugar, alcohol.

## 2022-07-15 LAB — CYTOLOGY, (ORAL, ANAL, URETHRAL) ANCILLARY ONLY
Chlamydia: NEGATIVE
Comment: NEGATIVE
Comment: NORMAL
Neisseria Gonorrhea: NEGATIVE

## 2022-08-07 ENCOUNTER — Encounter: Payer: Self-pay | Admitting: Emergency Medicine

## 2022-08-07 ENCOUNTER — Emergency Department: Payer: No Typology Code available for payment source

## 2022-08-07 ENCOUNTER — Emergency Department
Admission: EM | Admit: 2022-08-07 | Discharge: 2022-08-07 | Disposition: A | Discharge status: Home / Self Care | Payer: No Typology Code available for payment source | Attending: Emergency Medicine | Admitting: Emergency Medicine

## 2022-08-07 ENCOUNTER — Other Ambulatory Visit: Payer: Self-pay

## 2022-08-07 DIAGNOSIS — E871 Hypo-osmolality and hyponatremia: Secondary | ICD-10-CM | POA: Diagnosis not present

## 2022-08-07 DIAGNOSIS — R109 Unspecified abdominal pain: Secondary | ICD-10-CM | POA: Diagnosis present

## 2022-08-07 DIAGNOSIS — E1165 Type 2 diabetes mellitus with hyperglycemia: Secondary | ICD-10-CM | POA: Diagnosis not present

## 2022-08-07 DIAGNOSIS — E119 Type 2 diabetes mellitus without complications: Secondary | ICD-10-CM

## 2022-08-07 HISTORY — DX: Calculus of kidney: N20.0

## 2022-08-07 LAB — BASIC METABOLIC PANEL
Anion gap: 7 (ref 5–15)
BUN: 18 mg/dL (ref 6–20)
CO2: 23 mmol/L (ref 22–32)
Calcium: 9.3 mg/dL (ref 8.9–10.3)
Chloride: 102 mmol/L (ref 98–111)
Creatinine, Ser: 0.81 mg/dL (ref 0.61–1.24)
GFR, Estimated: >60 mL/min (ref >60)
Glucose, Bld: 393 mg/dL — ABNORMAL HIGH (ref 70–99)
Potassium: 4.5 mmol/L (ref 3.5–5.1)
Sodium: 132 mmol/L — ABNORMAL LOW (ref 135–145)

## 2022-08-07 LAB — URINALYSIS, ROUTINE W REFLEX MICROSCOPIC
Bacteria, UA: NONE SEEN
Bilirubin Urine: NEGATIVE
Glucose, UA: >=500 mg/dL — AB
Hgb urine dipstick: NEGATIVE
Ketones, ur: NEGATIVE mg/dL
Leukocytes,Ua: NEGATIVE
Nitrite: NEGATIVE
Protein, ur: NEGATIVE mg/dL
Specific Gravity, Urine: 1.033 — ABNORMAL HIGH (ref 1.005–1.030)
Squamous Epithelial / HPF: NONE SEEN (ref 0–5)
WBC, UA: NONE SEEN WBC/hpf (ref 0–5)
pH: 6 (ref 5.0–8.0)

## 2022-08-07 LAB — CBC
HCT: 43.5 % (ref 39.0–52.0)
Hemoglobin: 15.2 g/dL (ref 13.0–17.0)
MCH: 29.7 pg (ref 26.0–34.0)
MCHC: 34.9 g/dL (ref 30.0–36.0)
MCV: 85.1 fL (ref 80.0–100.0)
Platelets: 161 10*3/uL (ref 150–400)
RBC: 5.11 MIL/uL (ref 4.22–5.81)
RDW: 11.5 % (ref 11.5–15.5)
WBC: 6.4 10*3/uL (ref 4.0–10.5)
nRBC: 0 % (ref 0.0–0.2)

## 2022-08-07 LAB — CBG MONITORING, ED: Glucose-Capillary: 278 mg/dL — ABNORMAL HIGH (ref 70–99)

## 2022-08-07 MED ORDER — BLOOD GLUCOSE MONITOR KIT: PACK | 0 refills | Status: AC

## 2022-08-07 MED ORDER — BACLOFEN 10 MG PO TABS: 10.0000 mg | ORAL_TABLET | Freq: Three times a day (TID) | ORAL | 0 refills | Status: AC

## 2022-08-07 MED ORDER — METFORMIN HCL 500 MG PO TABS: 500.0000 mg | ORAL_TABLET | Freq: Two times a day (BID) | ORAL | 0 refills | Status: AC

## 2022-08-07 MED ORDER — METFORMIN HCL 1000 MG PO TABS: 1000.0000 mg | ORAL_TABLET | Freq: Two times a day (BID) | ORAL | 3 refills | Status: AC

## 2022-08-07 MED ORDER — MELOXICAM 15 MG PO TABS: 15.0000 mg | ORAL_TABLET | Freq: Every day | ORAL | 2 refills | Status: AC

## 2022-08-07 MED ORDER — SODIUM CHLORIDE 0.9 % IV BOLUS
1000.0000 mL | Freq: Once | INTRAVENOUS | Status: AC
  Administered 2022-08-07: 1000 mL via INTRAVENOUS

## 2022-08-07 NOTE — Discharge Instructions (Signed)
Follow up with [facility]; call for an appointment Take the medication as prescribed Test your glucose 3 times a day for 2 weeks, then at least 2x a day

## 2022-08-07 NOTE — ED Notes (Signed)
Pt A&O, IV removed, pt given discharge instructions, pt ambulating with steady gait. 

## 2022-08-07 NOTE — ED Provider Notes (Signed)
[facility] Provider Note    Event Date/Time   First MD Initiated Contact with Patient 08/07/22 1322     (approximate)   History   Flank Pain   HPI  Collin Peters is a 56 y.o. male with history of diabetes and kidney stones presents emergency department complaint of left sided flank pain.  Was seen in his doctor's office yesterday for routine follow-up and they said it might be a kidney stone.  No imaging was performed.  Patient denies fever or chills.  States pain radiates from the left flank into his testicle      Physical Exam   Triage Vital Signs: ED Triage Vitals  Enc Vitals Group     BP 08/07/22 1244 (!) 143/93     Pulse Rate 08/07/22 1244 85     Resp 08/07/22 1244 16     Temp 08/07/22 1244 98.1 F (36.7 C)     Temp Source 08/07/22 1244 Oral     SpO2 08/07/22 1244 96 %     Weight 08/07/22 1246 190 lb (86.2 kg)     Height 08/07/22 1246 '5\' 9"'  (1.753 m)     Head Circumference --      Peak Flow --      Pain Score 08/07/22 1246 9     Pain Loc --      Pain Edu? --      Excl. in [place]? --     Most recent vital signs: Vitals:   08/07/22 1244 08/07/22 1353  BP: (!) 143/93 129/79  Pulse: 85 79  Resp: 16 18  Temp: 98.1 F (36.7 C)   SpO2: 96% 95%     General: Awake, no distress.   CV:  Good peripheral perfusion. regular rate and  rhythm Resp:  Normal effort.  Abd:  No distention.  No tenderness in abd, no cva tenderness Other:     ED Results / Procedures / Treatments   Labs (all labs ordered are listed, but only abnormal results are displayed) Labs Reviewed  URINALYSIS, ROUTINE W REFLEX MICROSCOPIC - Abnormal; Notable for the following components:      Result Value   Color, Urine STRAW (*)    APPearance CLEAR (*)    Specific Gravity, Urine 1.033 (*)    Glucose, UA >=500 (*)    All other components within normal limits  BASIC METABOLIC PANEL - Abnormal; Notable for the following components:   Sodium 132 (*)     Glucose, Bld 393 (*)    All other components within normal limits  CBG MONITORING, ED - Abnormal; Notable for the following components:   Glucose-Capillary 278 (*)    All other components within normal limits  CBC     EKG     RADIOLOGY CT renal stone    PROCEDURES:   Procedures   MEDICATIONS ORDERED IN ED: Medications  sodium chloride 0.9 % bolus 1,000 mL (0 mLs Intravenous Stopped 08/07/22 1500)     IMPRESSION / MDM / ASSESSMENT AND PLAN / ED COURSE  I reviewed the triage vital signs and the nursing notes.                              Differential diagnosis includes, but is not limited to, pyelonephritis, kidney stone, infected kidney stone, muscle strain  Patient's presentation is most consistent with acute complicated illness / injury requiring diagnostic workup.   CT renal stone study  ordered  Labs are reassuring, basic metabolic panel shows sodium 132 with elevated glucose of 393 with a normal gap   Ct renal stone is negative for uretal stone, independently reviewed and interpreted by me.  I did explain findings to the patient.  He is to follow-up with [facility].  He was given 3 months worth of metformin.  Start on a low dose of 500 mg twice daily and increase after 2 weeks to 1000 mg twice daily.  Was also given a glucose monitoring kit.  He is to check 3 times daily for the first 2 weeks and then twice daily after that.  For his back pain I gave him prescription for meloxicam and baclofen.  He is to return emergency department if worsening.   FINAL CLINICAL IMPRESSION(S) / ED DIAGNOSES   Final diagnoses:  Flank pain  Diabetes mellitus type 2, noninsulin dependent ([place])     Rx / DC Orders   ED Discharge Orders          Ordered    metFORMIN (GLUCOPHAGE) 500 MG tablet  2 times daily with meals        08/07/22 1459    metFORMIN (GLUCOPHAGE) 1000 MG tablet  2 times daily with meals        08/07/22 1459    meloxicam (MOBIC) 15 MG tablet   Daily        08/07/22 1459    baclofen (LIORESAL) 10 MG tablet  3 times daily        08/07/22 1459    blood glucose meter kit and supplies KIT        08/07/22 1501             Note:  This document was prepared using Dragon voice recognition software and may include unintentional dictation errors.    Versie Starks, PA-C 08/07/22 1514    Vanessa Duquesne, MD 08/07/22 [phone]

## 2022-08-07 NOTE — ED Triage Notes (Signed)
Using medical interpreter- patient c/o left lower back pain radiating into his groin and testicle x 1 week. Was seen yesterday at clinic and diagnosed with kidney stones. States pain is worse today and is painful to walk or lift anything.

## 2022-09-04 ENCOUNTER — Ambulatory Visit: Payer: No Typology Code available for payment source | Admitting: Internal Medicine
